# Patient Record
Sex: Female | Born: 1955 | Race: White | Hispanic: No | Marital: Married
Health system: Southern US, Community
[De-identification: ages and names within clinical notes are randomized; demographics above are authoritative.]

---

## 2013-06-08 ENCOUNTER — Inpatient Hospital Stay: Payer: Self-pay | Admitting: Internal Medicine

## 2013-06-08 LAB — URINALYSIS, COMPLETE
Bilirubin,UR: NEGATIVE
Glucose,UR: NEGATIVE mg/dL (ref 0–75)
Protein: NEGATIVE

## 2013-06-08 LAB — APTT: Activated PTT: 30.1 secs (ref 23.6–35.9)

## 2013-06-08 LAB — COMPREHENSIVE METABOLIC PANEL
Alkaline Phosphatase: 115 U/L (ref 50–136)
Anion Gap: 9 (ref 7–16)
Calcium, Total: 9.1 mg/dL (ref 8.5–10.1)
Creatinine: 0.87 mg/dL (ref 0.60–1.30)
EGFR (African American): >60
EGFR (Non-African Amer.): >60
Glucose: 122 mg/dL — ABNORMAL HIGH (ref 65–99)
Osmolality: 276 (ref 275–301)
SGOT(AST): 34 U/L (ref 15–37)
Sodium: 137 mmol/L (ref 136–145)

## 2013-06-08 LAB — CBC WITH DIFFERENTIAL/PLATELET
Basophil #: 0.1 10*3/uL (ref 0.0–0.1)
Basophil %: 0.6 %
Eosinophil #: 0.2 10*3/uL (ref 0.0–0.7)
Eosinophil %: 2.1 %
HCT: 37.9 % (ref 35.0–47.0)
HGB: 13.4 g/dL (ref 12.0–16.0)
Lymphocyte #: 3.4 10*3/uL (ref 1.0–3.6)
MCHC: 35.2 g/dL (ref 32.0–36.0)
MCV: 89 fL (ref 80–100)
Monocyte #: 0.8 x10 3/mm (ref 0.2–0.9)
RBC: 4.26 10*6/uL (ref 3.80–5.20)
RDW: 12.6 % (ref 11.5–14.5)

## 2013-06-08 LAB — PROTIME-INR
INR: 0.9
Prothrombin Time: 12.3 secs (ref 11.5–14.7)

## 2013-06-09 LAB — BASIC METABOLIC PANEL
Anion Gap: 2 — ABNORMAL LOW (ref 7–16)
EGFR (African American): >60
EGFR (Non-African Amer.): >60
Glucose: 119 mg/dL — ABNORMAL HIGH (ref 65–99)
Potassium: 3.4 mmol/L — ABNORMAL LOW (ref 3.5–5.1)

## 2013-06-09 LAB — CBC WITH DIFFERENTIAL/PLATELET
Basophil #: 0 10*3/uL (ref 0.0–0.1)
Eosinophil %: 2.3 %
HCT: 28.6 % — ABNORMAL LOW (ref 35.0–47.0)
HGB: 10.1 g/dL — ABNORMAL LOW (ref 12.0–16.0)
Lymphocyte #: 0.9 10*3/uL — ABNORMAL LOW (ref 1.0–3.6)
Lymphocyte %: 10.3 %
MCH: 31.5 pg (ref 26.0–34.0)
Monocyte %: 7.3 %
Neutrophil %: 79.8 %
RBC: 3.22 10*6/uL — ABNORMAL LOW (ref 3.80–5.20)
RDW: 12.3 % (ref 11.5–14.5)

## 2013-06-09 LAB — MAGNESIUM: Magnesium: 1.2 mg/dL — ABNORMAL LOW

## 2013-06-10 LAB — CBC WITH DIFFERENTIAL/PLATELET
Basophil #: 0.1 10*3/uL (ref 0.0–0.1)
Basophil %: 0.6 %
Eosinophil %: 2.2 %
HCT: 23.4 % — ABNORMAL LOW (ref 35.0–47.0)
HGB: 8.4 g/dL — ABNORMAL LOW (ref 12.0–16.0)
Lymphocyte #: 1.1 10*3/uL (ref 1.0–3.6)
Lymphocyte %: 12.5 %
MCHC: 35.7 g/dL (ref 32.0–36.0)
MCV: 89 fL (ref 80–100)
Monocyte %: 7.1 %
Neutrophil #: 7 10*3/uL — ABNORMAL HIGH (ref 1.4–6.5)
Neutrophil %: 77.6 %
Platelet: 156 10*3/uL (ref 150–440)
RBC: 2.63 10*6/uL — ABNORMAL LOW (ref 3.80–5.20)
RDW: 12.4 % (ref 11.5–14.5)
WBC: 9 10*3/uL (ref 3.6–11.0)

## 2013-06-10 LAB — POTASSIUM: Potassium: 3.7 mmol/L (ref 3.5–5.1)

## 2013-06-11 LAB — CBC WITH DIFFERENTIAL/PLATELET
Basophil #: 0.1 10*3/uL (ref 0.0–0.1)
Basophil %: 0.6 %
Eosinophil #: 0.3 10*3/uL (ref 0.0–0.7)
Eosinophil %: 3.4 %
HCT: 22.2 % — ABNORMAL LOW (ref 35.0–47.0)
Lymphocyte #: 0.8 10*3/uL — ABNORMAL LOW (ref 1.0–3.6)
Lymphocyte %: 8.9 %
MCHC: 35.1 g/dL (ref 32.0–36.0)
MCV: 89 fL (ref 80–100)
Monocyte #: 0.7 x10 3/mm (ref 0.2–0.9)
Monocyte %: 7.1 %
Neutrophil #: 7.5 10*3/uL — ABNORMAL HIGH (ref 1.4–6.5)
Neutrophil %: 80 %
RBC: 2.5 10*6/uL — ABNORMAL LOW (ref 3.80–5.20)

## 2013-06-11 LAB — HEMOGLOBIN: HGB: 8.9 g/dL — ABNORMAL LOW (ref 12.0–16.0)

## 2013-06-12 LAB — CBC WITH DIFFERENTIAL/PLATELET
Basophil #: 0.1 10*3/uL (ref 0.0–0.1)
Basophil %: 1 %
HCT: 20.8 % — ABNORMAL LOW (ref 35.0–47.0)
HGB: 7.6 g/dL — ABNORMAL LOW (ref 12.0–16.0)
MCH: 32.4 pg (ref 26.0–34.0)
MCHC: 36.4 g/dL — ABNORMAL HIGH (ref 32.0–36.0)
Monocyte #: 0.6 x10 3/mm (ref 0.2–0.9)
Monocyte %: 8.8 %
Neutrophil #: 5.3 10*3/uL (ref 1.4–6.5)
Neutrophil %: 73 %
RBC: 2.34 10*6/uL — ABNORMAL LOW (ref 3.80–5.20)
RDW: 12.3 % (ref 11.5–14.5)
WBC: 7.2 10*3/uL (ref 3.6–11.0)

## 2013-06-13 LAB — HEMOGLOBIN: HGB: 8.9 g/dL — ABNORMAL LOW (ref 12.0–16.0)

## 2013-06-13 LAB — CREATININE, SERUM
Creatinine: 0.62 mg/dL (ref 0.60–1.30)
EGFR (Non-African Amer.): >60

## 2014-05-13 IMAGING — CR PELVIS - 1-2 VIEW
1 series · 1 of 1 positions shown · non-contrast
Comparison: None.

CLINICAL DATA: Status post fall; right hip pain.

EXAM:
PELVIS - 1-2 VIEW

[t pelvis ap]
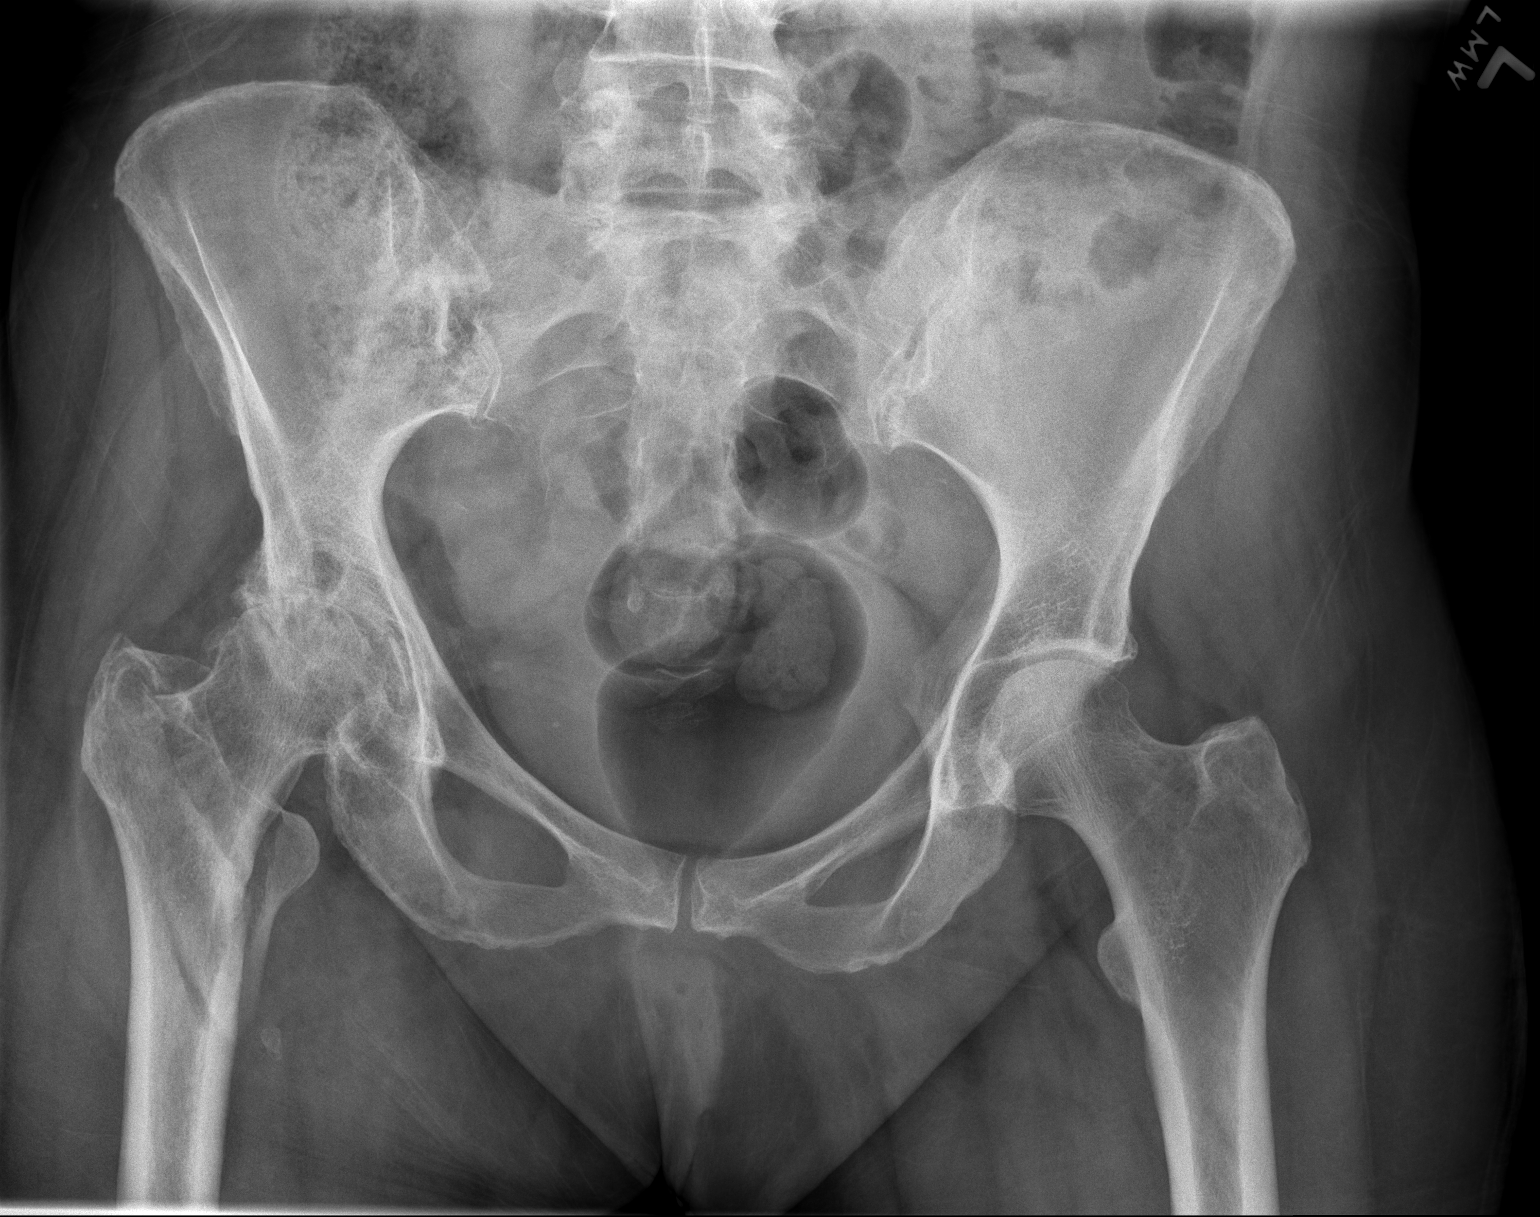

[1 of 1 positions shown; findings below may reference images not displayed]

FINDINGS: As noted on concurrent right hip radiographs, there is a mildly
displaced mildly comminuted intertrochanteric fracture involving the
proximal right femur, with a displaced lesser trochanteric fragment.

Degenerative change is noted at the right hip, with superior loss of
the joint space, underlying subcortical cystic change and diffuse
associated sclerosis.

The left hip joint is unremarkable in appearance. The sacroiliac
joints are grossly intact. The visualized bowel gas pattern is
grossly unremarkable.
IMPRESSION: 1. Mildly displaced mildly comminuted intertrochanteric fracture
involving the proximal right femur, with a displaced lesser
trochanteric fragment.
2. Degenerative osteoarthritis at the right hip, with loss of the
superior joint space, and underlying subcortical cystic change and
sclerosis.

## 2014-11-21 NOTE — H&P (Signed)
PATIENT NAME:  Nancy Walter, Nancy Walter MR#:  [id number] DATE OF BIRTH:  05-09-1956  DATE OF ADMISSION:  06/08/2013  REFERRING PHYSICIAN: Dr. Bayard Males.  PRIMARY CARE PHYSICIAN: [facility].   CHIEF COMPLAINT: Fall with right hip pain.   HISTORY OF PRESENT ILLNESS: This is a 59 year old female with significant past medical history of atrial fibrillation, usually in normal sinus rhythm on aspirin, hypertension, degenerative joint disease, hiatal hernia, presents with fall. The patient reports she was going into the kitchen, as she felt her hiatal hernia was acting up and she was belching. Reports she tripped; she fell on the left side, where she is having significant left hip pain. Denies any loss of consciousness and altered mental status, any fever, chills. On presentation to ED, the patient had right hip x-ray which did show evidence of comminuted intertrochanteric fracture involving the proximal right femur. The patient denies any head trauma. Had basic work-up done in the ED, where essentially all her labs were normal. The patient reports she did not have any recurrence of her a atrial fibrillation episodes almost for one year. She is currently following with [facility] for that. She is on Cardizem for that as well. The patient's EKG did show normal sinus rhythm. ED physician discussed with the orthopedic on-call, with plan to do surgical repair in the morning.  PAST MEDICAL HISTORY:  1.  Hypertension.  2.  Atrial fibrillation.  3.  Osteoarthritis.  4.  Hiatal hernia.   PAST SURGICAL HISTORY:  1.  Lumpectomy in 2003.  2.  Tonsillectomy as a child.   REVIEW OF SYSTEMS:  CONSTITUTIONAL:  Denies fever, chills, fatigue, weakness, weight gain, weight loss.  EYES: Denies blurry vision, double vision, inflammation, glaucoma.  ENT: Denies tinnitus, ear pain, epistaxis or discharge.  RESPIRATORY: Denies cough, wheezing, hemoptysis, dyspnea, chronic obstructive pulmonary disease.   CARDIOVASCULAR: Denies chest pain, orthopnea, edema, arrhythmia, palpitations, syncope.  GASTROINTESTINAL: Denies nausea, vomiting, diarrhea, abdominal pain, hematemesis, jaundice, rectal bleed.  GENITOURINARY: Denies dysuria, hematuria or renal colic.  ENDOCRINE: Denies polyuria, polydipsia, heat or cold intolerance.  HEMATOLOGY: Denies anemia, easy bruising, bleeding diathesis.  INTEGUMENTARY: Denies acne, rash or skin lesions.  MUSCULOSKELETAL: Complains of right hip pain. As well, reports history of arthritis.  NEUROLOGIC: Denies CVA, transient ischemic attack, headache, tremors, vertigo.  PSYCHIATRIC: Denies anxiety, insomnia, bipolar disorder or depression.   FAMILY HISTORY: Reports family history of coronary artery disease and diabetes mellitus in her father.   SOCIAL HISTORY: The patient has no history of smoking, alcohol or illicit drug use. Lives at home.   ALLERGIES: EPINEPHRINE.   HOME MEDICATIONS:  1.  Hydrochlorothiazide 12.5 mg oral daily.  2.  Aspirin 325 mg oral daily.  3.  Diltiazem 180 mg extended release daily.  4.  Loratadine 10 mg oral daily.  5.  Prilosec 20 mg oral daily.   PHYSICAL EXAMINATION:  VITAL SIGNS: Temperature 98.7, pulse 75, respiratory 28, blood pressure 162/84, saturating at 96% on room air.  GENERAL: Well-nourished female who looks comfortable in bed, in no apparent distress.  HEENT: Head atraumatic, normocephalic. Pupils are equal and reactive to light. Pink conjunctivae. Anicteric sclerae. Moist oral mucosa.  NECK: Supple. No thyromegaly. No JVD.  CHEST: Good air entry bilaterally. No wheezing, rales, rhonchi.  CARDIOVASCULAR: S1, S2 heard. No rubs, murmur or gallops.  ABDOMEN: Soft, nontender, nondistended. Bowel sounds present.  EXTREMITIES: No edema. No clubbing. No cyanosis. Distal pulses felt bilaterally. Right leg is slightly shorter than the left.  PSYCHIATRIC:  Appropriate affect. Awake, alert x3. Intact judgment and insight.   NEUROLOGIC: Cranial nerves grossly intact. Motor 5 out of 5. No focal deficits. LYMPHATIC:  No cervical or supraclavicular lymphadenopathy.  SKIN: Normal skin turgor. Warm and dry. No rash.   PERTINENT LABS: Glucose 122, BUN 16, creatinine 0.87, sodium 137, potassium 3.6, chloride 104, CO2 24, ALT 43, alkaline phosphatase 115, AST 34. White blood cells 10.7, hemoglobin 13.4, hematocrit 37.9, platelets 296. EKG showing normal sinus rhythm at 74 beats per minute without significant ST or T wave abnormalities.   IMAGING:  1.  Portable chest x-ray showing no acute cardiopulmonary findings.  2.  Right hip x-ray showing mildly communicated to intertrochanteric femur fracture.   ASSESSMENT AND PLAN:  1.  Right hip fracture, status post fall. The patient has a right comminuted intertrochanteric fracture and will need surgical repair. Orthopedics already consulted and plan to operate in the morning. The patient has no arrhythmias. No complaints of chest pain. No shortness of breath. Her blood work is essentially normal. No further work-up is indicated prior to surgery. The patient is considered low risk for surgery.  2.  Hypertension, out of control. This is most likely due to pain. We will resume her back on her Cardizem and hydrochlorothiazide.  3.  Atrial fibrillation, currently normal sinus rhythm. The patient is not on anticoagulation, as her CHADS score is 1. The patient can be resumed back on her aspirin after surgery.  4.  Deep vein thrombosis prophylaxis. Currently sequential compression device and TED hose. Can be started on chemical anticoagulation postoperatively.  5.  Gastrointestinal prophylaxis. The patient is on proton pump inhibitor.   CODE STATUS: Full code.   TOTAL TIME SPENT ON ADMISSION AND PATIENT CARE: 45 minutes.   ____________________________ Starleen Armsawood S. Pamala Hayman, MD dse:cg D: 06/08/2013 02:37:00 ET T: 06/08/2013 03:32:53 ET JOB#: [id number]  cc: Starleen Armsawood S. Kenzee Bassin, MD,  <Dictator> Kamoni Depree Teena IraniS Indria Bishara MD ELECTRONICALLY SIGNED 06/09/2013 0:05

## 2014-11-21 NOTE — Consult Note (Signed)
Brief Consult Note: Diagnosis: Right intertrochanteric hip fracture with subtrochanteric extension.   Patient was seen by consultant.   Consult note dictated.   Recommend to proceed with surgery or procedure.   Orders entered.   Comments: Patient has been cleared medically for surgery.  I have discussed the risks and benefits of surgery.  Consent was signed this morning.  Surgical site signed as per "right site surgery" protocol.  I have recommended a long intramedullary rod for fixation.  I answered all questions by the patient and her husband who was at the bedside.  Have discussed the case with Dr. Nemiah CommanderKalisetti.  Electronic Signatures: Juanell FairlyKrasinski, Muna Demers (MD)  (Signed [id number]-Nov-14 09:03)  Authored: Brief Consult Note   Last Updated: 08-Nov-14 09:03 by Juanell FairlyKrasinski, Janeal Abadi (MD)

## 2014-11-21 NOTE — Discharge Summary (Signed)
PATIENT NAME:  Nancy Walter, Nancy Walter MR#:  [id number] DATE OF BIRTH:  [birth year]  DATE OF ADMISSION:  06/08/2013 DATE OF DISCHARGE:  06/13/2013  ADDENDUM   The patient was not discharged yesterday.  She did receive a unit of PRBC for hemoglobin of 7.6.  There was a question of whether she was hypoxic, but there was no documented hypoxemia.  She did receive a unit of blood and her blood count today has up-trended appropriately.  Today it is 8.9.  She is not short of breath.  She will be discharged with home health today.    PHYSICAL EXAMINATION:  VITAL SIGNS:  On the day of discharge her vitals are as follows:  Temperature of 98.4, pulse rate 79, respiratory rate 20, blood pressure 123/77, O2 sat 98%.  GENERAL:  The patient is generally not acutely distressed, sitting on bed talking in full sentences.  HEENT:  Normocephalic, atraumatic.  Pupils are equal.  HEART:  Normal S1, S2.  LUNGS:  Clear.    The patient has bandages applied to the hip on the right.  Her pain is adequately controlled.  She could not afford Lovenox post surgery for the hip fracture.  She will be discharged on aspirin.   TOTAL TIME SPENT:  35 minutes.   CODE STATUS:  THE PATIENT IS FULL CODE.     ____________________________ Krystal EatonShayiq Hildagarde Holleran, MD sa:ea D: 06/13/2013 14:23:01 ET T: 06/13/2013 23:16:40 ET JOB#: [id number]  cc: Krystal EatonShayiq Calvyn Kurtzman, MD, <Dictator> Krystal EatonSHAYIQ Gurinder Toral MD ELECTRONICALLY SIGNED 06/20/2013 7:06

## 2014-11-21 NOTE — Op Note (Signed)
PATIENT NAME:  Nancy Walter, Nancy Walter MR#:  [id number] DATE OF BIRTH:  [birth year]  DATE OF PROCEDURE:  06/08/2013  PREOPERATIVE DIAGNOSIS:  Right intratrochanteric hip fracture with subtrochanteric extension.   POSTOPERATIVE DIAGNOSIS:  Right intratrochanteric hip fracture with subtrochanteric extension.    PROCEDURE:  Intramedullary nail fixation of right intratrochanteric hip fracture with subtrochanteric extension.   SURGEON:  Kathreen DevoidKevin L. Synthia Fairbank, M.D.   ANESTHESIA:  Spinal.   COMPLICATIONS:  None.   ESTIMATED BLOOD LOSS:  100 mL   IMPLANTS:  Biomet Affixus 11 x 380-mm, 130-degrees intramedullary nail with a 95-mm lag screw and a 48-mm bicortical distal interlocking screw.   INDICATIONS FOR PROCEDURE:  The patient is a 59 year old female who sustained a fall at home onto a hardwood floor, injuring her right hip. She was unable to stand or ambulate following the injury. She was brought to the [facility] where she was diagnosed with a comminuted intertrochanteric right hip fracture with subtrochanteric extension. The patient was admitted to the hospitalist service. I was consulted for further management of her hip fracture. I have recommended, based on the fracture pattern, an intramedullary fixation for the fracture. The patient has pre-existing severe advanced osteoarthritis in the right hip, but given the subtrochanteric extension of the fracture, I did not feel that treatment with a total hip arthroplasty was most prudent at this time. I reviewed the risks and benefits of surgery with the patient and her husband, who was at the bedside. These risks included infection, bleeding requiring blood transfusion, nerve or blood vessel injury, malunion, nonunion, leg length discrepancy, change in lower extremity rotation, persistent right hip pain and the need for further surgery including conversion to a total hip arthroplasty. Medical complications include, but are not limited  to, DVT and pulmonary embolism, myocardial infarction, stroke, pneumonia, respiratory failure and death. The patient understood these risks and signed a consent.   PROCEDURE NOTE:  I had signed the patient's right hip with the word "yes" according to the hospital's right site protocol. The patient was then brought to the Operating Room where she underwent placement of a spinal anesthetic by the anesthesiologist. She was placed supine on the fracture table. The right leg was placed in a legholder and the left leg was placed in a hemi-lithotomy position. All bony prominences were adequately padded. The patient had traction and slight internal rotation applied to the right lower extremity to align the fracture with intraoperative C-arm guidance. The patient received 1 gram of Kefzol prior to the onset of the case. She was prepped and draped in a sterile fashion.   A time-out was performed to verify the patient's name, date of birth, medical record number, correct site of surgery, and correct procedure to be performed. It was also used to verify the patient had received antibiotics and that all appropriate instruments, implants, and radiographic studies were available in the room. Once all in attendance were in agreement, the case began.   The proposed incisions were drawn out with a surgical marker after a C-arm and a drill pin were used to identify bony landmarks. A longitudinal incision over the lateral hip just superior to the tip of the greater trochanter was made. All bleeding vessels were cauterized. The deep fascia was then sharply incised with a deep #10 blade. The fibers of the abductor muscle were then bluntly split allowing for palpation of the tip of the greater trochanter of the right hip. A drill pin was then advanced into the tip of  the greater trochanter and its position was confirmed on AP and lateral C-arm images. This was then overdrilled with a 15-mm proximal femoral drill to the level of the  lesser trochanter. The drill pin was then removed and a ball-tip guidewire was then placed down the femoral shaft to approximately the superior edge of the patella. The guidewire's position was confirmed again on AP and lateral C-arm images. Sequential flexible reamers were used, starting with a size 9-mm and increasing to 13-mm. This allowed for placement of an 11-mm long intramedullary nail. A Biomet Affixus 130-degrees neck angle, 11 x 380-mm intramedullary nail was then advanced over the ball-tip guidewire and gently malleted into position. Its position again was confirmed on AP and lateral C-arm images. The ball-tip guidewire was then removed. The drill sleeve for the lag screw drill pin was then placed through the guide handle for the Affixus nail. A #10 blade was used to make an approximately 15-mm incision at the skin and a deep #10 blade again was used to make a small incision through the fascia lata. This allowed for approximation of the drill sleeve against the lateral cortex of the femur. The lag screw drill pin was then advanced from the lateral cortex of the femur across the fracture site and into the femoral head. Its position was confirmed on AP and lateral images and a tip apex distance of less than 25 mm was achieved. This was overdrilled to a depth of 95-mm as measured by a depth gauge. The actual 95-mm lag screw was then advanced into position by hand. Its final position again was confirmed on AP and lateral C-arm images. The set screw was then tightened down and then a quarter-turn counterclockwise turn was made with the screwdriver to allow for compression at the fracture site. The compression wheel was then tightened down to allow for a compression at the fracture site. The drill pin and its associated guide sleeve along with the guide arm of the Affixus nail were then removed. Final AP and lateral images of the proximal portion of the construct were taken. The attention was then turned to the  placement the of the distal interlocking screw. This was performed using a perfect-circle technique. A #10 blade was used to make a stab incision over the dynamic screw hole. The drill for the distal interlocking screws was then placed along the lateral cortex of the femur. Using a freehand technique, it was advanced through the dynamic screw hole, bicortically. A depth gauge was used to measure the depth of the femoral diameter at this level, which was 40 mm. A single 40-mm distal interlocking screw was then placed by hand. Final AP and lateral images of the distal construct was performed. All incisions were copiously irrigated. The deep fascia was closed in the proximal incision with a 0 Vicryl. The subcutaneous tissue was closed with 0 and 2-0 Vicryl. Subcutaneous sutures in the middle and distal incisions were also placed. The skin was approximated staples. A dry sterile dressing was applied including Xeroform. The patient was then transferred to a hospital bed and brought to the PACU in stable condition.   I was scrubbed and present for the entire case, and all sharp and instrument counts were correct at the conclusion of the case. I spoke with the patient's husband in the operative waiting room to let him know that his wife was stable in the Recovery Room and the case had gone without complication.   ____________________________ Kathreen Devoid, MD klk:jm  D: 06/08/2013 14:07:20 ET T: 06/08/2013 17:04:59 ET JOB#: [id number]  cc: Kathreen Devoid, MD, <Dictator> Kathreen Devoid MD ELECTRONICALLY SIGNED 06/24/2013 19:24

## 2014-11-21 NOTE — Consult Note (Signed)
PATIENT NAME:  Nancy Walter, Nancy Walter MR#:  [id number] DATE OF BIRTH:  Jun 27, 1956  ORTHOPEDIC CONSULTATION NOTE  DATE OF CONSULTATION:  11/08/201Arnetha Gula4  CONSULTING PHYSICIAN:  Kathreen DevoidKevin L. Rjay Revolorio, MD  REASON FOR CONSULTATION: Right intertrochanteric hip fracture with subtrochanteric extension.   HISTORY OF PRESENT ILLNESS: Ms. Nancy Walter is a 59 year old female who fell trying to get out of bed. She explains that she has a hiatal hernia and ate dinner very late tonight. She was having severe reflux and was getting out of bed quickly to avoid choking, by her report. She tripped and fell on her right side onto the hardwood floor, causing injury. The patient was unable to stand or ambulate following the injury and was brought to the [facility] for further evaluation. On x-ray, she was diagnosed with an intertrochanteric hip fracture with subtrochanteric extension and therefore was admitted to the medical service for preop clearance. Orthopedics was consulted for further management of the hip fracture.   PAST MEDICAL HISTORY: Includes hypertension, atrial fibrillation, osteoarthritis and hiatal hernia along with degenerative joint disease of the lumbar spine, right hip, knees and ankle, by the patient's report.   PAST SURGICAL HISTORY: Includes a lumpectomy in 2003 and tonsillectomy as a child.   HOME MEDICATIONS: Include:  1. Hydrochlorothiazide 12.5 mg p.o. daily.  2. Aspirin 325 mg daily.  3. Diltiazem 180 mg daily.  4. Loratadine 10 mg daily.  5. Prilosec 20 mg daily.   ALLERGIES: EPINEPHRINE.   PHYSICAL EXAMINATION:  GENERAL: The patient is alert and lying semi-reclined in her hospital bed. Her husband was at the bedside.  EXTREMITIES: The patient's examination of the right hip shows the skin is intact. There is no erythema, ecchymosis or swelling. The thigh and lower leg compartments are soft and compressible. The patient has intact sensation to light touch and palpable pedal  pulses. She can flex and extend all 5 toes. She is shortened and externally rotated. Her right leg is in a Buck's traction.   RADIOLOGY: I reviewed the patient's AP pelvis as well as right femur films. The patient has a comminuted intertrochanteric hip fracture which extends into the subtrochanteric region. The lesser trochanter is on a separate fragment.   ASSESSMENT: Right intertrochanteric hip fracture status post fall.   PLAN: Ms. Nancy Walter has been cleared by the medical service for surgery. I have recommended intramedullary fixation with a long rod given the extension of her fracture into the subtrochanteric region. I explained the risks and benefits of surgery to the patient and her husband. They understand the risks include infection, bleeding requiring blood transfusion, nerve or blood vessel injury, malunion, nonunion, change in leg length as well as change in lower extremity rotation, persistent right hip pain and the need for further surgery, including conversion to a total hip arthroplasty. Medical risks include, but are not limited to DVT and pulmonary embolism, myocardial infarction, stroke, pneumonia, respiratory failure and death. The patient signed the consent form. The patient has also signed a blood transfusion consent form, which I have signed. The patient had her right hip marked with the word "yes" according to the hospital's right site protocol. She has been n.p.o. after midnight. She was cleared for surgery, and surgery is scheduled for later this morning. I have answered all the questions by the patient and her husband.    ____________________________ Kathreen DevoidKevin L. Raguel Kosloski, MD klk:lb D: 06/08/2013 09:08:50 ET T: 06/08/2013 09:34:15 ET JOB#: [id number]  cc: Kathreen DevoidKevin L. Atwood Adcock, MD, <Dictator> Kathreen DevoidKEVIN L Syra Sirmons MD ELECTRONICALLY  SIGNED 06/24/2013 19:24

## 2014-11-21 NOTE — Discharge Summary (Signed)
PATIENT NAME:  Nancy Walter, Nancy Walter MR#:  [id number] DATE OF BIRTH:  1955/11/13  DATE OF ADMISSION:  06/08/2013 DATE OF DISCHARGE:  06/12/2013  ADMITTING PHYSICIAN: Starleen Armsawood S. Elgergawy, MD  DISCHARGING PHYSICIAN: Enid Baasadhika Marabella Popiel, MD  [facility]   CONSULTATIONS IN HOSPITAL: Orthopedic consultation by Dr. Martha ClanKrasinski.   DISCHARGE DIAGNOSES: 1.  Fall and right hip fracture, status post open reduction and internal fixation.  2.  Acute postoperative, anemia. Status post transfusion of 1 unit packed red blood cells while in the hospital.  3.  Atrial fibrillation.  4.  Hypertension.  5.  Postoperative fever which is resolved.  6.  Hiatal hernia.    DISCHARGE MEDICATIONS:  1.  Loratadine 10 mg p.o. daily.  2.  Calcium and vitamin D 500 mg/200 International Units 1 capsule p.o. b.i.d.  3.  Prilosec 20 mg p.o. daily.  4.  Ferrous sulfate 325 mg p.o. b.i.d.  5.  Topical hydrocortisone for maculopapular rash on the back.  6.  Cardizem 180 mg 1 capsule p.o. daily.  7.  Aspirin 325 mg p.o. b.i.d.  8.  Oxycodone 5 mg tablet 1 to 2 tablets every 4 hours as needed for pain.  8.  Colace 100 mg p.o. daily.   DISCHARGE DIET: Low-sodium diet.   DISCHARGE ACTIVITY: As tolerated.   FOLLOWUP INSTRUCTIONS:  1.  Home health physical therapy.  2.  PCP followup in 2 to 3 weeks.  3.  Follow up with Dr. Martha ClanKrasinski in 1 to 2 weeks.   LABORATORY AND IMAGING STUDIES PRIOR TO DISCHARGE:  1.  WBC 7.3, hemoglobin 7.6, hematocrit 20.8, platelet count 188.  2.  Chest x-ray, PA and lateral showing clear lung fields, no acute cardiopulmonary disease.  3.  Sodium 133, potassium 3.4, chloride 100, bicarb 31, BUN 6, creatinine 0.71, glucose 119 and calcium of 8.3.  4.  Right femur x-ray showing comminuted intertrochanteric fracture involving the proximal right femur and mildly displaced left intertrochanteric fragment.  Osteoarthritis of right hip and loss of  joint space with  sclerosis and cortical irregularity.   5.  Urinalysis negative for any infection.    BRIEF HOSPITAL COURSE: Nancy Walter is a 59 year old Caucasian female with past medical history significant for hypertension and atrial fibrillation who had a mechanical fall and presents to the hospital after a right hip fracture.  1.  Fall and right hip fracture based on the x-rays seen by orthopedic surgeon and had surgery done on 06/08/2013. Postoperatively, the patient has done remarkably well.  She had complications with postop anemia for which she required 1 unit packed RBC transfusion prior to discharge. She also had postoperative fever for a day which improved without any antibiotics. Chest x-ray was negative for any infiltrate. Urinalysis negative for any infection. She has worked with physical therapy and it was recommended the patient can go home with home health at this time. While she was in the hospital she was on Lovenox subcu b.i.d. for deep vein thrombosis prophylaxis, however, the co-pay was pretty expensive for her at discharge so she is being discharged on 325 mg p.o. b.i.d. of aspirin and will follow up with Dr. Martha ClanKrasinski in 1 week as an outpatient.  2.  Hypertension. Her home medication of Cardizem was continued. Her hydrochlorothiazide stopped secondary to dehydration and low-normal blood pressure while in the hospital.  3.  Paroxysmal atrial fibrillation, rate controlled while in the hospital. Continue Cardizem, on aspirin for anticoagulation.  4.  Hiatal hernia and gastroesophageal reflux  disease. The patient on Prilosec which was continued.   Her course has been otherwise uneventful in the hospital.   DISCHARGE CONDITION: Stable.   DISCHARGE DISPOSITION: Home with home health.   TIME SPENT ON DISCHARGE: 45 minutes.    ____________________________ Enid Baas, MD rk:cs D: 06/12/2013 14:54:36 ET T: 06/12/2013 19:31:02 ET JOB#: [id number]  cc: Enid Baas, MD,  <Dictator> [facility] Kathreen Devoid, MD Enid Baas MD ELECTRONICALLY SIGNED 06/25/2013 17:58
